# Patient Record
Sex: Male | Born: 1995 | Race: Black or African American | Hispanic: No | Marital: Single | Smoking: Never smoker
Health system: Southern US, Community
[De-identification: ages and names within clinical notes are randomized; demographics above are authoritative.]

---

## 2016-03-30 ENCOUNTER — Encounter: Payer: Self-pay | Admitting: Emergency Medicine

## 2016-03-30 ENCOUNTER — Emergency Department
Admission: EM | Admit: 2016-03-30 | Discharge: 2016-03-30 | Disposition: A | Discharge status: Home / Self Care | Payer: Medicaid Other | Attending: Emergency Medicine | Admitting: Emergency Medicine

## 2016-03-30 DIAGNOSIS — H66004 Acute suppurative otitis media without spontaneous rupture of ear drum, recurrent, right ear: Secondary | ICD-10-CM | POA: Insufficient documentation

## 2016-03-30 DIAGNOSIS — H66001 Acute suppurative otitis media without spontaneous rupture of ear drum, right ear: Secondary | ICD-10-CM

## 2016-03-30 DIAGNOSIS — H9191 Unspecified hearing loss, right ear: Secondary | ICD-10-CM | POA: Diagnosis present

## 2016-03-30 MED ORDER — AMOXICILLIN 500 MG PO CAPS: 500.0000 mg | ORAL_CAPSULE | Freq: Two times a day (BID) | ORAL | 0 refills | Status: AC

## 2016-03-30 NOTE — ED Notes (Signed)
Patient denies drainage and trauma however admits to tinnitus in the right ear

## 2016-03-30 NOTE — Discharge Instructions (Signed)
Take the antibiotic as directed. Follow-up with ENT as directed. Avoid getting any water into your ear.

## 2016-03-30 NOTE — ED Triage Notes (Signed)
Pt presents with intermittent hearing loss in right ear x approximately 2 weeks. Pt states it comes and goes, and when it comes "it stays a while." Pt denies pain; alert & oriented with NAD noted.

## 2016-03-30 NOTE — ED Provider Notes (Signed)
[facility] Emergency Department Provider Note ____________________________________________  Time seen: 1340  I have reviewed the triage vital signs and the nursing notes.  HISTORY  Chief Complaint  Hearing Loss  HPI Kenneth Meza is a 20 y.o. male presents to the ED for evaluation of intermittent hearing change to the right ear over the last 2 weeks. The patient comes in with decreased hearing at baseline to the right ear secondary to tympanic membrane tube placement less than 2 years ago. 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 This led to the placement of TM tubes late in his senior year. He reports at baseline he has slightly decreased hearing on that side. He reports over the last 2 weeks he is described but he notes as worsening of his hearing. He denies any ear pain, ear drainage, vertigo, dizziness, or ringing in the ears above baseline. He also denies any trauma to the ear, fluid in the ear, or recent illness. He has not followed up with his previous ear nose and throat provider nor has he seen a local primary care provider.  History reviewed. No pertinent past medical history.  There are no active problems to display for this patient.  History reviewed. No pertinent surgical history.  Prior to Admission medications   Medication Sig Start Date End Date Taking? Authorizing Provider  amoxicillin (AMOXIL) 500 MG capsule Take 1 capsule (500 mg total) by mouth 2 (two) times daily. 03/30/16 04/09/16  Jalesia Loudenslager V Bacon Cody Oliger, PA-C    Allergies Review of patient's allergies indicates no known allergies.  History reviewed. No pertinent family history.  Social History Social History  Substance Use Topics  . Smoking status: Never Smoker  . Smokeless tobacco: Never Used  . Alcohol use No   Review of Systems  Constitutional: Negative for fever. Eyes: Negative for visual changes. ENT: Negative for sore throat. Decreased hearing  over baseline as noted above. Neurological: Negative for headaches, focal weakness or numbness. ____________________________________________  PHYSICAL EXAM:  VITAL SIGNS: ED Triage Vitals  Enc Vitals Group     BP 03/30/16 1324 122/68     Pulse Rate 03/30/16 1324 86     Resp 03/30/16 1500 16     Temp 03/30/16 1324 98.5 F (36.9 C)     Temp Source 03/30/16 1324 Oral     SpO2 03/30/16 1324 98 %     Weight 03/30/16 1325 220 lb (99.8 kg)     Height 03/30/16 1325 5\' 9"  (1.753 m)     Head Circumference --      Peak Flow --      Pain Score 03/30/16 1500 1     Pain Loc --      Pain Edu? --      Excl. in GC? --     Constitutional: Alert and oriented. Well appearing and in no distress. Head: Normocephalic and atraumatic. Eyes: Conjunctivae are normal. PERRL. Normal extraocular movements Ears: Canals clear and TM intact on the left. Right canal with TM partially obscured by a small amount of cerumen. TM on the right is further obscured by purulent material in the canal at the TM. No clearly visible tympanoplasty tube.  Nose: No congestion/rhinorrhea. Mouth/Throat: Mucous membranes are moist. Neurologic:  Normal gait without ataxia. Normal speech and language. No gross focal neurologic deficits are appreciated. ____________________________________________  INITIAL IMPRESSION / ASSESSMENT AND PLAN / ED COURSE  Patient with a pure let drainage to the right ear  likely due to an intact and patent tympanoplasty tube. He will be discharged with a empiric prescription for amoxicillin to dose as directed. [facility] is [facility] [place] [facility] [place]. He is to avoid to getting water in the ear in the interim.   Clinical Course   ____________________________________________  FINAL CLINICAL IMPRESSION(S) / ED DIAGNOSES  Final diagnoses:  Acute suppurative otitis media of right ear without spontaneous rupture of tympanic membrane, recurrence not specified       Lissa HoardJenise V Bacon Aleck Locklin, PA-C 03/30/16 1922    Myrna Blazeravid Matthew Schaevitz, MD 03/31/16 [phone]

## 2016-05-02 MED ORDER — LIDOCAINE VISCOUS 2 % MT SOLN
OROMUCOSAL | Status: AC
  Filled 2016-05-02: qty 15

## 2016-08-30 ENCOUNTER — Encounter (HOSPITAL_COMMUNITY): Payer: Self-pay

## 2016-08-30 ENCOUNTER — Emergency Department (HOSPITAL_COMMUNITY): Payer: Medicaid Other

## 2016-08-30 ENCOUNTER — Observation Stay (HOSPITAL_COMMUNITY)
Admission: EM | Admit: 2016-08-30 | Discharge: 2016-09-01 | Disposition: A | Discharge status: Home / Self Care | Payer: Medicaid Other | Attending: General Surgery | Admitting: General Surgery

## 2016-08-30 DIAGNOSIS — K353 Acute appendicitis with localized peritonitis: Secondary | ICD-10-CM | POA: Diagnosis not present

## 2016-08-30 DIAGNOSIS — K3580 Unspecified acute appendicitis: Secondary | ICD-10-CM | POA: Diagnosis present

## 2016-08-30 DIAGNOSIS — K37 Unspecified appendicitis: Secondary | ICD-10-CM | POA: Diagnosis present

## 2016-08-30 DIAGNOSIS — K381 Appendicular concretions: Secondary | ICD-10-CM | POA: Insufficient documentation

## 2016-08-30 DIAGNOSIS — I10 Essential (primary) hypertension: Secondary | ICD-10-CM | POA: Diagnosis not present

## 2016-08-30 DIAGNOSIS — E119 Type 2 diabetes mellitus without complications: Secondary | ICD-10-CM | POA: Insufficient documentation

## 2016-08-30 DIAGNOSIS — R590 Localized enlarged lymph nodes: Secondary | ICD-10-CM | POA: Insufficient documentation

## 2016-08-30 LAB — URINALYSIS, ROUTINE W REFLEX MICROSCOPIC
Bilirubin Urine: NEGATIVE
GLUCOSE, UA: NEGATIVE mg/dL
Hgb urine dipstick: NEGATIVE
Ketones, ur: NEGATIVE mg/dL
LEUKOCYTES UA: NEGATIVE
Nitrite: NEGATIVE
PH: 6 (ref 5.0–8.0)
Protein, ur: NEGATIVE mg/dL
SPECIFIC GRAVITY, URINE: 1.023 (ref 1.005–1.030)

## 2016-08-30 LAB — CBC
HEMATOCRIT: 41.3 % (ref 39.0–52.0)
HEMOGLOBIN: 14.1 g/dL (ref 13.0–17.0)
MCH: 25.9 pg — ABNORMAL LOW (ref 26.0–34.0)
MCHC: 34.1 g/dL (ref 30.0–36.0)
MCV: 75.8 fL — AB (ref 78.0–100.0)
Platelets: 238 10*3/uL (ref 150–400)
RBC: 5.45 MIL/uL (ref 4.22–5.81)
RDW: 14.4 % (ref 11.5–15.5)
WBC: 21 10*3/uL — AB (ref 4.0–10.5)

## 2016-08-30 LAB — COMPREHENSIVE METABOLIC PANEL
ALT: 23 U/L (ref 17–63)
AST: 21 U/L (ref 15–41)
Albumin: 4.3 g/dL (ref 3.5–5.0)
Alkaline Phosphatase: 70 U/L (ref 38–126)
Anion gap: 7 (ref 5–15)
BUN: 9 mg/dL (ref 6–20)
CHLORIDE: 103 mmol/L (ref 101–111)
CO2: 27 mmol/L (ref 22–32)
Calcium: 9.4 mg/dL (ref 8.9–10.3)
Creatinine, Ser: 0.95 mg/dL (ref 0.61–1.24)
GFR calc Af Amer: >60 mL/min (ref >60)
Glucose, Bld: 115 mg/dL — ABNORMAL HIGH (ref 65–99)
POTASSIUM: 4.1 mmol/L (ref 3.5–5.1)
Sodium: 137 mmol/L (ref 135–145)
Total Bilirubin: 0.3 mg/dL (ref 0.3–1.2)
Total Protein: 7.5 g/dL (ref 6.5–8.1)

## 2016-08-30 LAB — LIPASE, BLOOD: LIPASE: 14 U/L (ref 11–51)

## 2016-08-30 MED ORDER — IOPAMIDOL (ISOVUE-300) INJECTION 61%
100.0000 mL | Freq: Once | INTRAVENOUS | Status: AC | PRN
  Administered 2016-08-30: 100 mL via INTRAVENOUS

## 2016-08-30 MED ORDER — SODIUM CHLORIDE 0.9 % IJ SOLN
INTRAMUSCULAR | Status: AC
  Filled 2016-08-30: qty 50

## 2016-08-30 MED ORDER — IOPAMIDOL (ISOVUE-300) INJECTION 61%
INTRAVENOUS | Status: AC
  Filled 2016-08-30: qty 100

## 2016-08-30 MED ORDER — MORPHINE SULFATE (PF) 4 MG/ML IV SOLN
4.0000 mg | Freq: Once | INTRAVENOUS | Status: AC
  Administered 2016-08-30: 4 mg via INTRAVENOUS
  Filled 2016-08-30: qty 1

## 2016-08-30 MED ORDER — ONDANSETRON HCL 4 MG/2ML IJ SOLN
4.0000 mg | Freq: Once | INTRAMUSCULAR | Status: AC
  Administered 2016-08-30: 4 mg via INTRAVENOUS
  Filled 2016-08-30: qty 2

## 2016-08-30 MED ORDER — SODIUM CHLORIDE 0.9 % IV BOLUS (SEPSIS)
1000.0000 mL | Freq: Once | INTRAVENOUS | Status: AC
  Administered 2016-08-30: 1000 mL via INTRAVENOUS

## 2016-08-30 NOTE — ED Notes (Signed)
Bed: WA06 Expected date:  Expected time:  Means of arrival:  Comments: Needs deep cleaning

## 2016-08-30 NOTE — ED Notes (Signed)
Patient transported to CT 

## 2016-08-30 NOTE — ED Provider Notes (Signed)
Emergency Department Provider Note   I have reviewed the triage vital signs and the nursing notes.   HISTORY  Chief Complaint Abdominal Pain   HPI Kenneth Meza is a 21 y.o. male with no significant PMH presents to the emergency department for evaluation of abdominal pain and vomiting. Patient states that this afternoon he was eating some supper and shortly afterwards developed some severe, sudden onset right sided abdominal pain. He had nausea and vomiting x 4. No diarrhea. No fevers or chills. No other sick contacts. He has no surgical history. He describes his pain is mostly right-sided, nonradiating, severe, constant. No modifying factors.   History reviewed. No pertinent past medical history.  Patient Active Problem List   Diagnosis Date Noted  . Appendicitis 08/31/2016    History reviewed. No pertinent surgical history.    Allergies Patient has no known allergies.  History reviewed. No pertinent family history.  Social History Social History  Substance Use Topics  . Smoking status: Never Smoker  . Smokeless tobacco: Never Used  . Alcohol use No    Review of Systems  Constitutional: No fever/chills Eyes: No visual changes. ENT: No sore throat. Cardiovascular: Denies chest pain. Respiratory: Denies shortness of breath. Gastrointestinal: Positive RLQ abdominal pain. Positive nausea and vomiting.  No diarrhea.  No constipation. Genitourinary: Negative for dysuria. Musculoskeletal: Negative for back pain. Skin: Negative for rash. Neurological: Negative for headaches, focal weakness or numbness.  10-point ROS otherwise negative.  ____________________________________________   PHYSICAL EXAM:  VITAL SIGNS: ED Triage Vitals  Enc Vitals Group     BP 08/30/16 2122 140/77     Pulse Rate 08/30/16 2122 94     Resp 08/30/16 2122 20     Temp 08/30/16 2122 98.2 F (36.8 C)     Temp src --      SpO2 08/30/16 2122 100 %     Weight 08/30/16 2122 220 lb  (99.8 kg)     Height 08/30/16 2122 6' (1.829 m)     Pain Score 08/30/16 2123 10   Constitutional: Alert and oriented. Well appearing and in no acute distress. Eyes: Conjunctivae are normal.  Head: Atraumatic. Nose: No congestion/rhinnorhea. Mouth/Throat: Mucous membranes are moist.  Oropharynx non-erythematous. Neck: No stridor. Cardiovascular: Normal rate, regular rhythm. Good peripheral circulation. Grossly normal heart sounds.   Respiratory: Normal respiratory effort.  No retractions. Lungs CTAB. Gastrointestinal: Soft with focal RLQ tenderness with guarding. No rebound. Positive rosving's signs.  No distention.  Musculoskeletal: No lower extremity tenderness nor edema. No gross deformities of extremities. Neurologic:  Normal speech and language. No gross focal neurologic deficits are appreciated.  Skin:  Skin is warm, dry and intact. No rash noted. Psychiatric: Mood and affect are normal. Speech and behavior are normal.  ____________________________________________   LABS (all labs ordered are listed, but only abnormal results are displayed)  Labs Reviewed  COMPREHENSIVE METABOLIC PANEL - Abnormal; Notable for the following:       Result Value   Glucose, Bld 115 (*)    All other components within normal limits  CBC - Abnormal; Notable for the following:    WBC 21.0 (*)    MCV 75.8 (*)    MCH 25.9 (*)    All other components within normal limits  SURGICAL PCR SCREEN  LIPASE, BLOOD  URINALYSIS, ROUTINE W REFLEX MICROSCOPIC  HIV ANTIBODY (ROUTINE TESTING)  SURGICAL PATHOLOGY   ____________________________________________  RADIOLOGY  Ct Abdomen Pelvis W Contrast  Result Date: 08/31/2016 CLINICAL DATA:  Sudden onset right lower quadrant pain EXAM: CT ABDOMEN AND PELVIS WITH CONTRAST TECHNIQUE: Multidetector CT imaging of the abdomen and pelvis was performed using the standard protocol following bolus administration of intravenous contrast. CONTRAST:  ISOVUE-300  IOPAMIDOL (ISOVUE-300) INJECTION 61% COMPARISON:  None. FINDINGS: Lower chest: No acute abnormality. Hepatobiliary: No focal liver abnormality is seen. No gallstones, gallbladder wall thickening, or biliary dilatation. Pancreas: Unremarkable. No pancreatic ductal dilatation or surrounding inflammatory changes. Spleen: Normal in size without focal abnormality. Adrenals/Urinary Tract: Adrenal glands are unremarkable. Kidneys are normal, without renal calculi, focal lesion, or hydronephrosis. Bladder is unremarkable. Stomach/Bowel: Stomach is nonenlarged.  No dilated small bowel. The appendix is enlarged, measuring up to 12 mm. There is an 8 mm appendicolith in the proximal lumen of the appendix. There is wall enhancement and moderate fluid and inflammatory change around the appendix and within the right para colic gutter. Vascular/Lymphatic: Multiple enlarged mesenteric lymph nodes, measuring up to 9 mm. Non aneurysmal aorta. Reproductive: Prostate is unremarkable. Other: Small free fluid in the pelvis.  No free air. Musculoskeletal: No acute or significant osseous findings. IMPRESSION: 1. Findings consistent with acute appendicitis. 8 mm appendicolith in the proximal lumen of the appendix. Appendix is retrocecal. No extraluminal gas or evidence for an abscess. 2. Small amount of free fluid in the pelvis 3. Multiple enlarged mesenteric lymph nodes. Electronically Signed   By: Jasmine Pang M.D.   On: 08/31/2016 00:12    ____________________________________________   PROCEDURES  Procedure(s) performed:   Procedures  None ____________________________________________   INITIAL IMPRESSION / ASSESSMENT AND PLAN / ED COURSE  Pertinent labs & imaging results that were available during my care of the patient were reviewed by me and considered in my medical decision making (see chart for details).  Patient presents to the emergency department for evaluation of right lower quadrant abdominal pain associated  nausea and vomiting. Patient has had subjective fevers and chills prior to arrival. His white blood cell count is 21. He has focal tenderness in his right lower quadrant with positive Rovsing sign and guarding in the right lower quadrant. Plan for CT abdomen/pelvis to evaluate for appendicitis.   CT pending. Care transferred to night physician who will follow up scan and involve surgery PRN.   ____________________________________________  FINAL CLINICAL IMPRESSION(S) / ED DIAGNOSES  Final diagnoses:  Acute appendicitis, unspecified acute appendicitis type     MEDICATIONS GIVEN DURING THIS VISIT:  Medications  sodium chloride 0.9 % injection (not administered)  iopamidol (ISOVUE-300) 61 % injection (not administered)  0.9 %  sodium chloride infusion ( Intravenous New Bag/Given 08/31/16 0525)  piperacillin-tazobactam (ZOSYN) IVPB 3.375 g ( Intravenous Automatically Held 09/08/16 2200)  morphine 4 MG/ML injection 1-3 mg ( Intravenous MAR Hold 08/31/16 0825)  diphenhydrAMINE (BENADRYL) 12.5 MG/5ML elixir 12.5 mg ( Oral MAR Hold 08/31/16 0825)    Or  diphenhydrAMINE (BENADRYL) injection 12.5 mg ( Intravenous MAR Hold 08/31/16 0825)  ondansetron (ZOFRAN-ODT) disintegrating tablet 4 mg ( Oral MAR Hold 08/31/16 0825)    Or  ondansetron (ZOFRAN) injection 4 mg ( Intravenous MAR Hold 08/31/16 0825)  pantoprazole (PROTONIX) injection 40 mg ( Intravenous Automatically Held 09/08/16 2200)  lactated ringers infusion ( Intravenous New Bag/Given 08/31/16 0842)  bupivacaine (PF) (MARCAINE) 0.25 % injection (30 mLs Infiltration Given 08/31/16 0000)  lactated ringers irrigation solution (1,000 mLs Irrigation Given 08/31/16 0000)  sodium chloride 0.9 % bolus 1,000 mL (0 mLs Intravenous Stopped 08/31/16 0040)  morphine 4 MG/ML injection 4 mg (4 mg  Intravenous Given 08/30/16 2311)  ondansetron (ZOFRAN) injection 4 mg (4 mg Intravenous Given 08/30/16 2311)  iopamidol (ISOVUE-300) 61 % injection 100 mL (100 mLs  Intravenous Contrast Given 08/30/16 2354)  cefTRIAXone (ROCEPHIN) 2 g in dextrose 5 % 50 mL IVPB (0 g Intravenous Stopped 08/31/16 0135)    And  metroNIDAZOLE (FLAGYL) IVPB 500 mg (0 mg Intravenous Stopped 08/31/16 0249)     NEW OUTPATIENT MEDICATIONS STARTED DURING THIS VISIT:  None   Note:  This document was prepared using Dragon voice recognition software and may include unintentional dictation errors.  Alona Bene, MD Emergency Medicine   Maia Plan, MD 08/31/16 [phone]

## 2016-08-30 NOTE — ED Triage Notes (Signed)
Abdominal pain with nausea and vomiting with chills and fever, no dysuria voiced.

## 2016-08-31 ENCOUNTER — Encounter (HOSPITAL_COMMUNITY)
Admission: EM | Disposition: A | Discharge status: Home / Self Care | Payer: Self-pay | Source: Home / Self Care | Attending: Emergency Medicine

## 2016-08-31 ENCOUNTER — Ambulatory Visit: Admit: 2016-08-31 | Payer: Medicaid Other | Admitting: Surgery

## 2016-08-31 ENCOUNTER — Observation Stay (HOSPITAL_COMMUNITY): Payer: Medicaid Other | Admitting: Anesthesiology

## 2016-08-31 DIAGNOSIS — K381 Appendicular concretions: Secondary | ICD-10-CM | POA: Diagnosis not present

## 2016-08-31 DIAGNOSIS — E119 Type 2 diabetes mellitus without complications: Secondary | ICD-10-CM | POA: Diagnosis not present

## 2016-08-31 DIAGNOSIS — K37 Unspecified appendicitis: Secondary | ICD-10-CM | POA: Diagnosis present

## 2016-08-31 DIAGNOSIS — R590 Localized enlarged lymph nodes: Secondary | ICD-10-CM | POA: Diagnosis not present

## 2016-08-31 DIAGNOSIS — K353 Acute appendicitis with localized peritonitis: Secondary | ICD-10-CM | POA: Diagnosis not present

## 2016-08-31 HISTORY — PX: LAPAROSCOPIC APPENDECTOMY: SHX408

## 2016-08-31 LAB — SURGICAL PCR SCREEN
MRSA, PCR: NEGATIVE
Staphylococcus aureus: NEGATIVE

## 2016-08-31 LAB — GLUCOSE, CAPILLARY
Glucose-Capillary: 126 mg/dL — ABNORMAL HIGH (ref 65–99)
Glucose-Capillary: 161 mg/dL — ABNORMAL HIGH (ref 65–99)

## 2016-08-31 SURGERY — APPENDECTOMY, LAPAROSCOPIC
Anesthesia: General | Site: Abdomen

## 2016-08-31 MED ORDER — ROCURONIUM BROMIDE 50 MG/5ML IV SOSY
PREFILLED_SYRINGE | INTRAVENOUS | Status: AC
  Filled 2016-08-31: qty 5

## 2016-08-31 MED ORDER — LACTATED RINGERS IR SOLN
Status: DC | PRN
  Administered 2016-08-31: 1000 mL

## 2016-08-31 MED ORDER — ROCURONIUM BROMIDE 10 MG/ML (PF) SYRINGE
PREFILLED_SYRINGE | INTRAVENOUS | Status: DC | PRN
  Administered 2016-08-31: 30 mg via INTRAVENOUS
  Administered 2016-08-31: 20 mg via INTRAVENOUS

## 2016-08-31 MED ORDER — HYDROMORPHONE HCL 1 MG/ML IJ SOLN
0.2500 mg | INTRAMUSCULAR | Status: DC | PRN
  Administered 2016-08-31 (×4): 0.25 mg via INTRAVENOUS

## 2016-08-31 MED ORDER — LIDOCAINE 2% (20 MG/ML) 5 ML SYRINGE
INTRAMUSCULAR | Status: AC
  Filled 2016-08-31: qty 5

## 2016-08-31 MED ORDER — ONDANSETRON HCL 4 MG/2ML IJ SOLN
INTRAMUSCULAR | Status: DC | PRN
  Administered 2016-08-31: 4 mg via INTRAVENOUS

## 2016-08-31 MED ORDER — MORPHINE SULFATE (PF) 4 MG/ML IV SOLN: 1.0000 mg | INTRAVENOUS | Status: DC | PRN

## 2016-08-31 MED ORDER — PHENYLEPHRINE 40 MCG/ML (10ML) SYRINGE FOR IV PUSH (FOR BLOOD PRESSURE SUPPORT)
PREFILLED_SYRINGE | INTRAVENOUS | Status: AC
Start: 2016-08-31 — End: 2016-08-31
  Filled 2016-08-31: qty 10

## 2016-08-31 MED ORDER — ONDANSETRON HCL 4 MG/2ML IJ SOLN
INTRAMUSCULAR | Status: AC
  Filled 2016-08-31: qty 2

## 2016-08-31 MED ORDER — IBUPROFEN 200 MG PO TABS: 600.0000 mg | ORAL_TABLET | Freq: Four times a day (QID) | ORAL | Status: DC | PRN

## 2016-08-31 MED ORDER — SUGAMMADEX SODIUM 200 MG/2ML IV SOLN
INTRAVENOUS | Status: DC | PRN
  Administered 2016-08-31: 200 mg via INTRAVENOUS

## 2016-08-31 MED ORDER — PIPERACILLIN-TAZOBACTAM 3.375 G IVPB
3.3750 g | Freq: Three times a day (TID) | INTRAVENOUS | Status: DC
  Administered 2016-08-31 – 2016-09-01 (×4): 3.375 g via INTRAVENOUS
  Filled 2016-08-31 (×6): qty 50

## 2016-08-31 MED ORDER — SUCCINYLCHOLINE CHLORIDE 200 MG/10ML IV SOSY
PREFILLED_SYRINGE | INTRAVENOUS | Status: AC
  Filled 2016-08-31: qty 10

## 2016-08-31 MED ORDER — SUCCINYLCHOLINE CHLORIDE 200 MG/10ML IV SOSY
PREFILLED_SYRINGE | INTRAVENOUS | Status: DC | PRN
  Administered 2016-08-31: 120 mg via INTRAVENOUS

## 2016-08-31 MED ORDER — BUPIVACAINE HCL (PF) 0.25 % IJ SOLN
INTRAMUSCULAR | Status: AC
  Filled 2016-08-31: qty 30

## 2016-08-31 MED ORDER — FENTANYL CITRATE (PF) 100 MCG/2ML IJ SOLN
INTRAMUSCULAR | Status: DC | PRN
  Administered 2016-08-31: 100 ug via INTRAVENOUS
  Administered 2016-08-31: 50 ug via INTRAVENOUS

## 2016-08-31 MED ORDER — MIDAZOLAM HCL 2 MG/2ML IJ SOLN
INTRAMUSCULAR | Status: AC
  Filled 2016-08-31: qty 2

## 2016-08-31 MED ORDER — KETOROLAC TROMETHAMINE 30 MG/ML IJ SOLN: 30.0000 mg | Freq: Once | INTRAMUSCULAR | Status: DC | PRN

## 2016-08-31 MED ORDER — METRONIDAZOLE IN NACL 5-0.79 MG/ML-% IV SOLN
500.0000 mg | Freq: Once | INTRAVENOUS | Status: AC
  Administered 2016-08-31: 500 mg via INTRAVENOUS
  Filled 2016-08-31: qty 100

## 2016-08-31 MED ORDER — FENTANYL CITRATE (PF) 250 MCG/5ML IJ SOLN
INTRAMUSCULAR | Status: AC
  Filled 2016-08-31: qty 5

## 2016-08-31 MED ORDER — MORPHINE SULFATE (PF) 4 MG/ML IV SOLN
1.0000 mg | INTRAVENOUS | Status: DC | PRN
  Administered 2016-08-31: 2 mg via INTRAVENOUS
  Filled 2016-08-31: qty 1

## 2016-08-31 MED ORDER — BUPIVACAINE HCL (PF) 0.25 % IJ SOLN
INTRAMUSCULAR | Status: DC | PRN
  Administered 2016-08-31: 30 mL

## 2016-08-31 MED ORDER — ONDANSETRON 4 MG PO TBDP: 4.0000 mg | ORAL_TABLET | Freq: Four times a day (QID) | ORAL | Status: DC | PRN

## 2016-08-31 MED ORDER — SODIUM CHLORIDE 0.9 % IV SOLN
INTRAVENOUS | Status: DC
  Administered 2016-08-31: 16:00:00 via INTRAVENOUS

## 2016-08-31 MED ORDER — DEXAMETHASONE SODIUM PHOSPHATE 10 MG/ML IJ SOLN
INTRAMUSCULAR | Status: DC | PRN
  Administered 2016-08-31: 10 mg via INTRAVENOUS

## 2016-08-31 MED ORDER — HYDROCODONE-ACETAMINOPHEN 5-325 MG PO TABS: 1.0000 | ORAL_TABLET | Freq: Four times a day (QID) | ORAL | Status: DC | PRN

## 2016-08-31 MED ORDER — PROPOFOL 10 MG/ML IV BOLUS
INTRAVENOUS | Status: DC | PRN
  Administered 2016-08-31: 200 mg via INTRAVENOUS

## 2016-08-31 MED ORDER — LACTATED RINGERS IV SOLN
INTRAVENOUS | Status: DC
  Administered 2016-08-31 (×2): via INTRAVENOUS

## 2016-08-31 MED ORDER — PANTOPRAZOLE SODIUM 40 MG IV SOLR: 40.0000 mg | Freq: Every day | INTRAVENOUS | Status: DC

## 2016-08-31 MED ORDER — MIDAZOLAM HCL 5 MG/5ML IJ SOLN
INTRAMUSCULAR | Status: DC | PRN
  Administered 2016-08-31: 2 mg via INTRAVENOUS

## 2016-08-31 MED ORDER — DEXAMETHASONE SODIUM PHOSPHATE 10 MG/ML IJ SOLN
INTRAMUSCULAR | Status: AC
  Filled 2016-08-31: qty 1

## 2016-08-31 MED ORDER — SUGAMMADEX SODIUM 200 MG/2ML IV SOLN
INTRAVENOUS | Status: AC
  Filled 2016-08-31: qty 2

## 2016-08-31 MED ORDER — DIPHENHYDRAMINE HCL 50 MG/ML IJ SOLN: 12.5000 mg | Freq: Four times a day (QID) | INTRAMUSCULAR | Status: DC | PRN

## 2016-08-31 MED ORDER — DEXTROSE 5 % IV SOLN
2.0000 g | Freq: Once | INTRAVENOUS | Status: AC
  Administered 2016-08-31: 2 g via INTRAVENOUS
  Filled 2016-08-31: qty 2

## 2016-08-31 MED ORDER — LIDOCAINE 2% (20 MG/ML) 5 ML SYRINGE
INTRAMUSCULAR | Status: DC | PRN
  Administered 2016-08-31: 100 mg via INTRAVENOUS

## 2016-08-31 MED ORDER — PROMETHAZINE HCL 25 MG/ML IJ SOLN
6.2500 mg | INTRAMUSCULAR | Status: AC | PRN
  Administered 2016-08-31 (×2): 6.25 mg via INTRAVENOUS

## 2016-08-31 MED ORDER — ONDANSETRON HCL 4 MG/2ML IJ SOLN: 4.0000 mg | Freq: Four times a day (QID) | INTRAMUSCULAR | Status: DC | PRN

## 2016-08-31 MED ORDER — PROPOFOL 10 MG/ML IV BOLUS
INTRAVENOUS | Status: AC
  Filled 2016-08-31: qty 20

## 2016-08-31 MED ORDER — SODIUM CHLORIDE 0.9 % IV SOLN
INTRAVENOUS | Status: DC
  Administered 2016-08-31 (×2): via INTRAVENOUS

## 2016-08-31 MED ORDER — DIPHENHYDRAMINE HCL 12.5 MG/5ML PO ELIX: 12.5000 mg | ORAL_SOLUTION | Freq: Four times a day (QID) | ORAL | Status: DC | PRN

## 2016-08-31 MED ORDER — PROMETHAZINE HCL 25 MG/ML IJ SOLN
INTRAMUSCULAR | Status: AC
  Administered 2016-08-31: 6.25 mg via INTRAVENOUS
  Filled 2016-08-31: qty 1

## 2016-08-31 MED ORDER — HYDROMORPHONE HCL 1 MG/ML IJ SOLN
INTRAMUSCULAR | Status: AC
  Administered 2016-08-31: 0.25 mg via INTRAVENOUS
  Filled 2016-08-31: qty 1

## 2016-08-31 SURGICAL SUPPLY — 38 items
APPLIER CLIP ROT 10 11.4 M/L (STAPLE)
BENZOIN TINCTURE PRP APPL 2/3 (GAUZE/BANDAGES/DRESSINGS) IMPLANT
CABLE HIGH FREQUENCY MONO STRZ (ELECTRODE) ×3 IMPLANT
CHLORAPREP W/TINT 26ML (MISCELLANEOUS) ×3 IMPLANT
CLIP APPLIE ROT 10 11.4 M/L (STAPLE) IMPLANT
CLOSURE WOUND 1/2 X4 (GAUZE/BANDAGES/DRESSINGS)
COVER SURGICAL LIGHT HANDLE (MISCELLANEOUS) ×3 IMPLANT
CUTTER FLEX LINEAR 45M (STAPLE) IMPLANT
DECANTER SPIKE VIAL GLASS SM (MISCELLANEOUS) IMPLANT
DERMABOND ADVANCED (GAUZE/BANDAGES/DRESSINGS) ×2
DERMABOND ADVANCED .7 DNX12 (GAUZE/BANDAGES/DRESSINGS) ×1 IMPLANT
DRAPE LAPAROSCOPIC ABDOMINAL (DRAPES) ×3 IMPLANT
ELECT PENCIL ROCKER SW 15FT (MISCELLANEOUS) ×3 IMPLANT
ELECT REM PT RETURN 9FT ADLT (ELECTROSURGICAL) ×3
ELECTRODE REM PT RTRN 9FT ADLT (ELECTROSURGICAL) ×1 IMPLANT
ENDOLOOP SUT PDS II  0 18 (SUTURE)
ENDOLOOP SUT PDS II 0 18 (SUTURE) IMPLANT
GLOVE SURG SIGNA 7.5 PF LTX (GLOVE) ×3 IMPLANT
GOWN STRL REUS W/TWL XL LVL3 (GOWN DISPOSABLE) ×6 IMPLANT
IRRIG SUCT STRYKERFLOW 2 WTIP (MISCELLANEOUS) ×3
IRRIGATION SUCT STRKRFLW 2 WTP (MISCELLANEOUS) ×1 IMPLANT
KIT BASIN OR (CUSTOM PROCEDURE TRAY) ×3 IMPLANT
L-HOOK LAP DISP 36CM (ELECTROSURGICAL) ×3
LHOOK LAP DISP 36CM (ELECTROSURGICAL) ×1 IMPLANT
POUCH SPECIMEN RETRIEVAL 10MM (ENDOMECHANICALS) ×3 IMPLANT
RELOAD 45 VASCULAR/THIN (ENDOMECHANICALS) IMPLANT
RELOAD STAPLE TA45 3.5 REG BLU (ENDOMECHANICALS) ×3 IMPLANT
SCISSORS LAP 5X35 DISP (ENDOMECHANICALS) ×3 IMPLANT
SHEARS HARMONIC ACE PLUS 36CM (ENDOMECHANICALS) ×3 IMPLANT
SLEEVE XCEL OPT CAN 5 100 (ENDOMECHANICALS) ×3 IMPLANT
STRIP CLOSURE SKIN 1/2X4 (GAUZE/BANDAGES/DRESSINGS) IMPLANT
SUT MNCRL AB 4-0 PS2 18 (SUTURE) ×6 IMPLANT
SUT VIC AB 2-0 SH 18 (SUTURE) IMPLANT
TOWEL OR 17X26 10 PK STRL BLUE (TOWEL DISPOSABLE) ×3 IMPLANT
TOWEL OR NON WOVEN STRL DISP B (DISPOSABLE) ×3 IMPLANT
TRAY LAPAROSCOPIC (CUSTOM PROCEDURE TRAY) ×3 IMPLANT
TROCAR BLADELESS OPT 5 100 (ENDOMECHANICALS) ×3 IMPLANT
TROCAR XCEL BLUNT TIP 100MML (ENDOMECHANICALS) ×3 IMPLANT

## 2016-08-31 NOTE — H&P (Signed)
Kenneth Meza is an 21 y.o. male.   Chief Complaint: abdominal pain, nausea and vomiting HPI: Pt presented to the ED with nausea and vomiting, abdominal pain.  It started after eating lunch, sudden onset, pain was in RLQ.  Pain nausea and vomiting became worse, he may have had some fever but did not take his temperature.  His family brought him to the ED for evaluation.   Work up in the ED shows he is afebrile, VSS.  WBC 21K, other labs are normal.CT scan shows:  1. Findings consistent with acute appendicitis. 8 mm appendicolith in the proximal lumen of the appendix. Appendix is retrocecal. No extraluminal gas or evidence for an abscess. 2. Small amount of free fluid in the pelvis 3. Multiple enlarged mesenteric lymph nodes.   We are ask to see.   History reviewed. No pertinent past medical history. None History reviewed. No pertinent surgical history. Tympanoplasty   History reviewed. No pertinent family history.  + AODM + Hypertension  Social History:  reports that he has never smoked. He has never used smokeless tobacco. He reports that he does not drink alcohol or use drugs.   Tobacco - none ETOH - none Drugs - none Student and works at [employer]: No Known Allergies  Medications Prior to Admission  Medication Sig Dispense Refill  . bismuth subsalicylate (PEPTO BISMOL) 262 MG/15ML suspension Take 30 mLs by mouth every 6 (six) hours as needed.      Results for orders placed or performed during the hospital encounter of 08/30/16 (from the past 48 hour(s))  Lipase, blood     Status: None   Collection Time: 08/30/16  9:27 PM  Result Value Ref Range   Lipase 14 11 - 51 U/L  Comprehensive metabolic panel     Status: Abnormal   Collection Time: 08/30/16  9:27 PM  Result Value Ref Range   Sodium 137 135 - 145 mmol/L   Potassium 4.1 3.5 - 5.1 mmol/L   Chloride 103 101 - 111 mmol/L   CO2 27 22 - 32 mmol/L   Glucose, Bld 115 (H) 65 - 99 mg/dL   BUN 9 6 - 20 mg/dL    Creatinine, Ser 0.95 0.61 - 1.24 mg/dL   Calcium 9.4 8.9 - 10.3 mg/dL   Total Protein 7.5 6.5 - 8.1 g/dL   Albumin 4.3 3.5 - 5.0 g/dL   AST 21 15 - 41 U/L   ALT 23 17 - 63 U/L   Alkaline Phosphatase 70 38 - 126 U/L   Total Bilirubin 0.3 0.3 - 1.2 mg/dL   GFR calc non Af Amer >60 >60 mL/min   GFR calc Af Amer >60 >60 mL/min    Comment: (NOTE) The eGFR has been calculated using the CKD EPI equation. This calculation has not been validated in all clinical situations. eGFR's persistently <60 mL/min signify possible Chronic Kidney Disease.    Anion gap 7 5 - 15  CBC     Status: Abnormal   Collection Time: 08/30/16  9:27 PM  Result Value Ref Range   WBC 21.0 (H) 4.0 - 10.5 K/uL   RBC 5.45 4.22 - 5.81 MIL/uL   Hemoglobin 14.1 13.0 - 17.0 g/dL   HCT 41.3 39.0 - 52.0 %   MCV 75.8 (L) 78.0 - 100.0 fL   MCH 25.9 (L) 26.0 - 34.0 pg   MCHC 34.1 30.0 - 36.0 g/dL   RDW 14.4 11.5 - 15.5 %   Platelets 238 150 -  400 K/uL  Urinalysis, Routine w reflex microscopic     Status: None   Collection Time: 08/30/16 10:29 PM  Result Value Ref Range   Color, Urine YELLOW YELLOW   APPearance CLEAR CLEAR   Specific Gravity, Urine 1.023 1.005 - 1.030   pH 6.0 5.0 - 8.0   Glucose, UA NEGATIVE NEGATIVE mg/dL   Hgb urine dipstick NEGATIVE NEGATIVE   Bilirubin Urine NEGATIVE NEGATIVE   Ketones, ur NEGATIVE NEGATIVE mg/dL   Protein, ur NEGATIVE NEGATIVE mg/dL   Nitrite NEGATIVE NEGATIVE   Leukocytes, UA NEGATIVE NEGATIVE  Surgical pcr screen     Status: None   Collection Time: 08/31/16  5:27 AM  Result Value Ref Range   MRSA, PCR NEGATIVE NEGATIVE   Staphylococcus aureus NEGATIVE NEGATIVE    Comment:        The Xpert SA Assay (FDA approved for NASAL specimens in patients over 1 years of age), is one component of a comprehensive surveillance program.  Test performance has been validated by [facility] for patients greater than or equal to 57 year old. It is not intended to diagnose infection  nor to guide or monitor treatment.    Ct Abdomen Pelvis W Contrast  Result Date: 08/31/2016 CLINICAL DATA:  Sudden onset right lower quadrant pain EXAM: CT ABDOMEN AND PELVIS WITH CONTRAST TECHNIQUE: Multidetector CT imaging of the abdomen and pelvis was performed using the standard protocol following bolus administration of intravenous contrast. CONTRAST:  122m ISOVUE-300 IOPAMIDOL (ISOVUE-300) INJECTION 61% COMPARISON:  None. FINDINGS: Lower chest: No acute abnormality. Hepatobiliary: No focal liver abnormality is seen. No gallstones, gallbladder wall thickening, or biliary dilatation. Pancreas: Unremarkable. No pancreatic ductal dilatation or surrounding inflammatory changes. Spleen: Normal in size without focal abnormality. Adrenals/Urinary Tract: Adrenal glands are unremarkable. Kidneys are normal, without renal calculi, focal lesion, or hydronephrosis. Bladder is unremarkable. Stomach/Bowel: Stomach is nonenlarged.  No dilated small bowel. The appendix is enlarged, measuring up to 12 mm. There is an 8 mm appendicolith in the proximal lumen of the appendix. There is wall enhancement and moderate fluid and inflammatory change around the appendix and within the right para colic gutter. Vascular/Lymphatic: Multiple enlarged mesenteric lymph nodes, measuring up to 9 mm. Non aneurysmal aorta. Reproductive: Prostate is unremarkable. Other: Small free fluid in the pelvis.  No free air. Musculoskeletal: No acute or significant osseous findings. IMPRESSION: 1. Findings consistent with acute appendicitis. 8 mm appendicolith in the proximal lumen of the appendix. Appendix is retrocecal. No extraluminal gas or evidence for an abscess. 2. Small amount of free fluid in the pelvis 3. Multiple enlarged mesenteric lymph nodes. Electronically Signed   By: KDonavan FoilM.D.   On: 08/31/2016 00:12    Review of Systems  Constitutional: Positive for fever (?).  HENT: Negative.   Eyes: Negative.   Respiratory:  Negative.   Cardiovascular: Negative.   Gastrointestinal: Positive for abdominal pain, nausea and vomiting. Negative for blood in stool, constipation, diarrhea, heartburn and melena.  Genitourinary: Negative.   Musculoskeletal: Negative.   Skin: Negative.   Neurological: Negative.   Endo/Heme/Allergies: Negative.   Psychiatric/Behavioral: Negative.     Blood pressure (!) 159/78, pulse 60, temperature 99 F (37.2 C), temperature source Oral, resp. rate 20, height 6' (1.829 m), weight 99.8 kg (220 lb), SpO2 99 %. Physical Exam  Constitutional: He is oriented to person, place, and time. He appears well-developed and well-nourished. No distress.  HENT:  Head: Normocephalic and atraumatic.  Mouth/Throat: No oropharyngeal exudate.  Eyes: Right  eye exhibits no discharge. Left eye exhibits no discharge. No scleral icterus.  Neck: Normal range of motion. Neck supple. No JVD present. No tracheal deviation present. No thyromegaly present.  Cardiovascular: Normal rate, regular rhythm, normal heart sounds and intact distal pulses.   No murmur heard. Respiratory: Effort normal and breath sounds normal. No respiratory distress. He has no wheezes. He has no rales. He exhibits no tenderness.  GI: Soft. Bowel sounds are normal. He exhibits no distension and no mass. There is tenderness (RLQ). There is no rebound and no guarding.  Musculoskeletal: He exhibits no edema or tenderness.  Lymphadenopathy:    He has no cervical adenopathy.  Neurological: He is alert and oriented to person, place, and time. No cranial nerve deficit.  Skin: Skin is warm and dry. No rash noted. He is not diaphoretic. No erythema. No pallor.  Psychiatric: He has a normal mood and affect. His behavior is normal. Judgment and thought content normal.     Assessment/Plan Acute appendicitis BMI 33  Plan: Surgery later this a.m.   JENNINGS,WILLARD, PA-C 08/31/2016, 7:17 AM  Agree with above. Mother and girlfriend in room.   (The girlfriend was asleep).  I discussed with the patient the indications and risks of appendiceal surgery.  The primary risks of appendiceal surgery include, but are not limited to, bleeding, infection, bowel surgery, and open surgery.  There is also the risk that the patient may have continued symptoms after surgery.  We discussed the typical post-operative recovery course. I tried to answer the patient's questions.  He is a sophomore at [employer].  Alphonsa Overall, MD, [facility] Pager: [phone] Office phone:  [phone]

## 2016-08-31 NOTE — Anesthesia Procedure Notes (Signed)
Procedure Name: Intubation Date/Time: 08/31/2016 9:27 AM Performed by: Lind Covert Pre-anesthesia Checklist: Patient identified, Emergency Drugs available, Suction available, Patient being monitored and Timeout performed Patient Re-evaluated:Patient Re-evaluated prior to inductionOxygen Delivery Method: Circle system utilized Preoxygenation: Pre-oxygenation with 100% oxygen Intubation Type: IV induction Laryngoscope Size: Mac and 4 Grade View: Grade I Tube size: 7.5 mm Number of attempts: 1 Airway Equipment and Method: Stylet Placement Confirmation: ETT inserted through vocal cords under direct vision,  positive ETCO2 and breath sounds checked- equal and bilateral Secured at: 22 cm Tube secured with: Tape Dental Injury: Teeth and Oropharynx as per pre-operative assessment

## 2016-08-31 NOTE — Anesthesia Preprocedure Evaluation (Signed)

## 2016-08-31 NOTE — Discharge Instructions (Signed)
Laparoscopic Appendectomy, Adult, Care After °Refer to this sheet in the next few weeks. These instructions provide you with information about caring for yourself after your procedure. Your health care provider may also give you more specific instructions. Your treatment has been planned according to current medical practices, but problems sometimes occur. Call your health care provider if you have any problems or questions after your procedure. °What can I expect after the procedure? °After the procedure, it is common to have: °· A decrease in your energy level. °· Mild pain in the area where the surgical cuts (incisions) were made. °· Constipation. This can be caused by pain medicine and a decrease in your activity. ° °Follow these instructions at home: °Medicines °· Take over-the-counter and prescription medicines only as told by your health care provider. °· Do not drive for 24 hours if you received a sedative. °· Do not drive or operate heavy machinery while taking prescription pain medicine. °· If you were prescribed an antibiotic medicine, take it as told by your health care provider. Do not stop taking the antibiotic even if you start to feel better. °Activity °· For 3 weeks or as long as told by your health care provider: °? Do not lift anything that is heavier than 10 pounds (4.5 kg). °? Do not play contact sports. °· Gradually return to your normal activities. Ask your health care provider what activities are safe for you. °Bathing °· Keep your incisions clean and dry. Clean them as often as told by your health care provider: °? Gently wash the incisions with soap and water. °? Rinse the incisions with water to remove all soap. °? Pat the incisions dry with a clean towel. Do not rub the incisions. °· You may take showers after 48 hours. °· Do not take baths, swim, or use hot tubs for 2 weeks or as told by your health care provider. °Incision care °· Follow instructions from your healthcare provider about  how to take care of your incisions. Make sure you: °? Wash your hands with soap and water before you change your bandage (dressing). If soap and water are not available, use hand sanitizer. °? Change your dressing as told by your health care provider. °? Leave stitches (sutures), skin glue, or adhesive strips in place. These skin closures may need to stay in place for 2 weeks or longer. If adhesive strip edges start to loosen and curl up, you may trim the loose edges. Do not remove adhesive strips completely unless your health care provider tells you to do that. °· Check your incision areas every day for signs of infection. Check for: °? More redness, swelling, or pain. °? More fluid or blood. °? Warmth. °? Pus or a bad smell. °Other Instructions °· If you were sent home with a drain, follow instructions from your health care provider about how to care for the drain and how to empty it. °· Take deep breaths. This helps to prevent your lungs from becoming inflamed. °· To relieve and prevent constipation: °? Drink plenty of fluids. °? Eat plenty of fruits and vegetables. °· Keep all follow-up visits as told by your health care provider. This is important. °Contact a health care provider if: °· You have more redness, swelling, or pain around an incision. °· You have more fluid or blood coming from an incision. °· Your incision feels warm to the touch. °· You have pus or a bad smell coming from an incision or dressing. °· Your incision   edges break open after your sutures have been removed. °· You have increasing pain in your shoulders. °· You feel dizzy or you faint. °· You develop shortness of breath. °· You keep feeling nauseous or vomiting. °· You have diarrhea or you cannot control your bowel functions. °· You lose your appetite. °· You develop swelling or pain in your legs. °Get help right away if: °· You have a fever. °· You develop a rash. °· You have difficulty breathing. °· You have sharp pains in your  chest. °This information is not intended to replace advice given to you by your health care provider. Make sure you discuss any questions you have with your health care provider. °Document Released: 06/25/2005 Document Revised: 11/25/2015 Document Reviewed: 12/13/2014 °Elsevier Interactive Patient Education © 2017 [employer] ° °CCS ______[facility] °LAPAROSCOPIC SURGERY: POST OP INSTRUCTIONS °Always review your discharge instruction sheet given to you by the facility where your surgery was performed. °IF YOU HAVE DISABILITY OR FAMILY LEAVE FORMS, YOU MUST BRING THEM TO THE OFFICE FOR PROCESSING.   °DO NOT GIVE THEM TO YOUR DOCTOR. ° °1. A prescription for pain medication may be given to you upon discharge.  Take your pain medication as prescribed, if needed.  If narcotic pain medicine is not needed, then you may take acetaminophen (Tylenol) or ibuprofen (Advil) as needed. °2. Take your usually prescribed medications unless otherwise directed. °3. If you need a refill on your pain medication, please contact your pharmacy.  They will contact our office to request authorization. Prescriptions will not be filled after 5pm or on week-ends. °4. You should follow a light diet the first few days after arrival home, such as soup and crackers, etc.  Be sure to include lots of fluids daily. °5. Most patients will experience some swelling and bruising in the area of the incisions.  Ice packs will help.  Swelling and bruising can take several days to resolve.  °6. It is common to experience some constipation if taking pain medication after surgery.  Increasing fluid intake and taking a stool softener (such as Colace) will usually help or prevent this problem from occurring.  A mild laxative (Milk of Magnesia or Miralax) should be taken according to package instructions if there are no bowel movements after 48 hours. °7. Unless discharge instructions indicate otherwise, you may remove your bandages 24-48  hours after surgery, and you may shower at that time.  You may have steri-strips (small skin tapes) in place directly over the incision.  These strips should be left on the skin for 7-10 days.  If your surgeon used skin glue on the incision, you may shower in 24 hours.  The glue will flake off over the next 2-3 weeks.  Any sutures or staples will be removed at the office during your follow-up visit. °8. ACTIVITIES:  You may resume regular (light) daily activities beginning the next day--such as daily self-care, walking, climbing stairs--gradually increasing activities as tolerated.  You may have sexual intercourse when it is comfortable.  Refrain from any heavy lifting or straining until approved by your doctor. °a. You may drive when you are no longer taking prescription pain medication, you can comfortably wear a seatbelt, and you can safely maneuver your car and apply brakes. °b. RETURN TO WORK:  __________________________________________________________ °9. You should see your doctor in the office for a follow-up appointment approximately 2-3 weeks after your surgery.  Make sure that you call for this appointment within a day or   two after you arrive home to insure a convenient appointment time. °10. OTHER INSTRUCTIONS: __________________________________________________________________________________________________________________________ __________________________________________________________________________________________________________________________ °WHEN TO CALL YOUR DOCTOR: °1. Fever over 101.0 °2. Inability to urinate °3. Continued bleeding from incision. °4. Increased pain, redness, or drainage from the incision. °5. Increasing abdominal pain ° °The clinic staff is available to answer your questions during regular business hours.  Please don’t hesitate to call and ask to speak to one of the nurses for clinical concerns.  If you have a medical emergency, go to the nearest emergency room or call  911.  A surgeon from [facility] is always on call at the hospital. °[address], [address], [place], [place]  [address] ? [address] [address], [place], [place]   [address] °[phone] ? [phone] ? FAX [phone] °Web site: [email or web address] ° °

## 2016-08-31 NOTE — Transfer of Care (Signed)
Immediate Anesthesia Transfer of Care Note  Patient: Kenneth Meza  Procedure(s) Performed: Procedure(s): APPENDECTOMY LAPAROSCOPIC (N/A)  Patient Location: PACU  Anesthesia Type:General  Level of Consciousness: sedated  Airway & Oxygen Therapy: Patient Spontanous Breathing and Patient connected to face mask oxygen  Post-op Assessment: Report given to RN and Post -op Vital signs reviewed and stable  Post vital signs: Reviewed and stable  Last Vitals:  Vitals:   08/31/16 0319 08/31/16 0451  BP: 145/84 (!) 159/78  Pulse: 60   Resp: 20 20  Temp:  37.2 C    Last Pain:  Vitals:   08/31/16 0621  TempSrc:   PainSc: 0-No pain      Patients Stated Pain Goal: 2 (08/31/16 0545)  Complications: No apparent anesthesia complications

## 2016-08-31 NOTE — Anesthesia Postprocedure Evaluation (Signed)
Anesthesia Post Note  Patient: Pesach Albertine PatriciaJ Hallmon  Procedure(s) Performed: Procedure(s) (LRB): APPENDECTOMY LAPAROSCOPIC (N/A)  Patient location during evaluation: PACU Anesthesia Type: General Level of consciousness: awake and alert Pain management: pain level controlled Vital Signs Assessment: post-procedure vital signs reviewed and stable Respiratory status: spontaneous breathing, nonlabored ventilation, respiratory function stable and patient connected to nasal cannula oxygen Cardiovascular status: blood pressure returned to baseline and stable Postop Assessment: no signs of nausea or vomiting Anesthetic complications: no       Last Vitals:  Vitals:   08/31/16 1115 08/31/16 1130  BP: (!) 155/98 (!) 168/74  Pulse: 91 96  Resp: 15 (!) 26  Temp: 36.3 C 36.8 C    Last Pain:  Vitals:   08/31/16 1125  TempSrc:   PainSc: 5                  Donetta Isaza S

## 2016-08-31 NOTE — ED Notes (Signed)
ED Provider at bedside. 

## 2016-08-31 NOTE — Op Note (Signed)
Re:   Kenneth Meza DOB:   05/30/1996 MRN:   [id number]                   [facility]  DATE OF PROCEDURE: 08/31/2016                              OPERATIVE REPORT  PREOPERATIVE DIAGNOSIS:  Appendicitis  POSTOPERATIVE DIAGNOSIS:  Acute purulent appendicitis.  PROCEDURE:  Laparoscopic appendectomy.  SURGEON:  Sandria Balesavid H. Kenneth StandingNewman, MD  ASSISTANT:  Shelbie ProctorMatt Wallman, PA student  ANESTHESIA:  General endotracheal.  Anesthesiologist: Kenneth GhaziGeorge Rose, MD CRNA: Kenneth PaceJoanne E Gray, CRNA; Doran ClayStephen R Alday, CRNA  ASA:  1E  ESTIMATED BLOOD LOSS:  Minimal.  DRAINS: none   SPECIMEN:   Appendix  COUNTS CORRECT:  YES  INDICATIONS FOR PROCEDURE: Kenneth Meza is a 21 y.o. (DOB: 10/09/1995) AA male whose primary care doctor is No PCP Per Patient and comes to the OR for an appendectomy.   I discussed with the patient, the indications and potential complications of appendiceal surgery.  The potential complications include, but are not limited to, bleeding, open surgery, bowel resection, and the possibility of another diagnosis.  OPERATIVE NOTE:  The patient underwent a general endotracheal anesthetic as supervised by Anesthesiologist: Kenneth GhaziGeorge Rose, MD CRNA: Kenneth PaceJoanne E Gray, CRNA; Doran ClayStephen R Alday, CRNA, General, in room #4.  The patient was on Zosyn prio the beginning of the procedure and the abdomen was prepped with ChloraPrep.   A time-out was held and surgical checklist run.  An infraumbilical incision was made with sharp dissection carried down to the abdominal cavity.  An 12 mm Hasson trocar was inserted through the infraumbilical incision and into the peritoneal cavity.  A 0 degree 10 mm laparoscope was inserted through a 12 mm Hasson trocar and the Hasson trocar secured with a 0 Vicryl suture.  I placed a 5 mm trocar in the right upper quadrant and 5 mm torcar in left lower quadrant and did abdominal exploration.    The right and left lobes of liver unremarkable.  Stomach was unremarkable.  The pelvic  organs were unremarkable.  I saw no other intra-abdominal abnormality.  The patient had purulent appendicitis with the appendix curled in the retrocecum.  The appendix had an appearance of being more chronic, in that it had a dense scar that stuck it along the posterior cecum.  The mesentery of the appendix was divided with a Harmonic scalpel.  I got to the base of the appendix.  I then used a blue load 45 mm Ethicon Endo-GIA stapler and fired this across the base of the appendix.  I placed the appendix in EndoCatch bag and delivered the bag through the umbilical incision.  I irrigated the abdomen with 600 cc of saline.  After irrigating the abdomen, I then removed the trocars, in turn.  The umbilical port fascia was closed with 0 Vicryl suture.   I closed the skin each site with a 4-0 Monocryl suture and painted the wounds with DermaBond.  I then injected a total of 30 mL of 0.25% Marcaine at the incisions.  Sponge and needle count were correct at the end of the case.  The patient was transferred to the recovery room in good condition.  The patient tolerated the procedure well and it depends on the patient's post op clinical course as to when the patient could be discharged.   Kenneth Kinavid Shameka Aggarwal, MD,  [facility] Pager: [phone] Office phone:  [phone]

## 2016-08-31 NOTE — ED Notes (Signed)
Pt ambulatory to bathroom

## 2016-09-01 LAB — HIV ANTIBODY (ROUTINE TESTING W REFLEX): HIV SCREEN 4TH GENERATION: NONREACTIVE

## 2016-09-01 MED ORDER — HYDROCODONE-ACETAMINOPHEN 5-325 MG PO TABS: 1.0000 | ORAL_TABLET | Freq: Four times a day (QID) | ORAL | 0 refills | Status: DC | PRN

## 2016-09-01 NOTE — Discharge Summary (Addendum)
Patient ID: Kenneth Meza [id number] 21 y.o. 08/04/1995  08/30/2016  Discharge date and time: No discharge date for patient encounter.  Admitting Physician: Dr [name]  Discharge Physician: [name], [name]  Admission Diagnoses: Acute appendicitis, unspecified acute appendicitis type [K35.80]  Discharge Diagnoses: acute appendicitis  Operations: Procedure(s): APPENDECTOMY LAPAROSCOPIC    Discharged Condition: good    Hospital Course: Pt admitted for overnight observation.  Did well.  Consults: None  Significant Diagnostic Studies: labs: cbc, chemistry  Treatments: IV hydration, antibiotics: Zosyn, analgesia: Vicodin and surgery: lap appy  Disposition: Home    I have searched the NCCSRS database and see no other narcotics prescriptions for this patient.

## 2016-09-01 NOTE — Progress Notes (Signed)
Mr. Kenneth Meza has been an inpatient from 2/22 - 09/01/16 at [facility]. He may return to school on Mon., 2/26. Rondall AllegraFred Tamyra Fojtik RN [phone]

## 2016-09-03 ENCOUNTER — Encounter (HOSPITAL_COMMUNITY): Payer: Self-pay | Admitting: Surgery

## 2016-12-10 NOTE — Addendum Note (Signed)
Addendum  created 12/10/16 1131 by Natarsha Hurwitz, MD   Sign clinical note    

## 2016-12-10 NOTE — Anesthesia Postprocedure Evaluation (Signed)
Anesthesia Post Note  Patient: Kenneth Meza  Procedure(s) Performed: Procedure(s) (LRB): APPENDECTOMY LAPAROSCOPIC (N/A)     Anesthesia Post Evaluation  Last Vitals:  Vitals:   09/01/16 0157 09/01/16 0525  BP: 115/67 125/71  Pulse: 62 89  Resp: 18 16  Temp: 36.8 C 36.8 C    Last Pain:  Vitals:   09/01/16 0525  TempSrc: Oral  PainSc: Asleep                 Manreet Kiernan S

## 2019-02-25 ENCOUNTER — Emergency Department: Payer: BC Managed Care – PPO

## 2019-02-25 ENCOUNTER — Emergency Department
Admission: EM | Admit: 2019-02-25 | Discharge: 2019-02-25 | Disposition: A | Discharge status: Home / Self Care | Payer: BC Managed Care – PPO | Attending: Emergency Medicine | Admitting: Emergency Medicine

## 2019-02-25 ENCOUNTER — Other Ambulatory Visit: Payer: Self-pay

## 2019-02-25 ENCOUNTER — Encounter: Payer: Self-pay | Admitting: Emergency Medicine

## 2019-02-25 DIAGNOSIS — Y999 Unspecified external cause status: Secondary | ICD-10-CM | POA: Diagnosis not present

## 2019-02-25 DIAGNOSIS — S39012A Strain of muscle, fascia and tendon of lower back, initial encounter: Secondary | ICD-10-CM | POA: Insufficient documentation

## 2019-02-25 DIAGNOSIS — Y92410 Unspecified street and highway as the place of occurrence of the external cause: Secondary | ICD-10-CM | POA: Diagnosis not present

## 2019-02-25 DIAGNOSIS — Y939 Activity, unspecified: Secondary | ICD-10-CM | POA: Diagnosis not present

## 2019-02-25 DIAGNOSIS — M7918 Myalgia, other site: Secondary | ICD-10-CM

## 2019-02-25 DIAGNOSIS — S3992XA Unspecified injury of lower back, initial encounter: Secondary | ICD-10-CM | POA: Diagnosis present

## 2019-02-25 MED ORDER — TRAMADOL HCL 50 MG PO TABS: 50.0000 mg | ORAL_TABLET | Freq: Four times a day (QID) | ORAL | 0 refills | Status: AC | PRN

## 2019-02-25 MED ORDER — IBUPROFEN 600 MG PO TABS: 600.0000 mg | ORAL_TABLET | Freq: Three times a day (TID) | ORAL | 0 refills | Status: AC | PRN

## 2019-02-25 MED ORDER — CYCLOBENZAPRINE HCL 10 MG PO TABS: 10.0000 mg | ORAL_TABLET | Freq: Three times a day (TID) | ORAL | 0 refills | Status: AC | PRN
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
## 2023-10-03 ENCOUNTER — Ambulatory Visit
Admission: EM | Admit: 2023-10-03 | Discharge: 2023-10-03 | Disposition: A | Discharge status: Home / Self Care | Attending: Emergency Medicine | Admitting: Emergency Medicine

## 2023-10-03 DIAGNOSIS — J069 Acute upper respiratory infection, unspecified: Secondary | ICD-10-CM

## 2023-10-03 LAB — POC COVID19/FLU A&B COMBO
Covid Antigen, POC: NEGATIVE
Influenza A Antigen, POC: NEGATIVE
Influenza B Antigen, POC: NEGATIVE

## 2023-10-03 NOTE — Discharge Instructions (Addendum)
 The COVID and flu tests are negative.   Take Tylenol or ibuprofen as needed for fever or discomfort.  Take plain Mucinex as needed for congestion.  Rest and keep yourself hydrated.    Follow-up with your primary care provider if your symptoms are not improving.  Urgency department if you have worsening symptoms.

## 2023-10-03 NOTE — ED Triage Notes (Signed)
 Patient to Urgent Care with complaints of cough/ headaches/ fatigue/ neck pain. Possible fever yesterday (unable to check temp). Body aches.   Symptoms started two days ago.  Taking tylenol.

## 2023-10-03 NOTE — ED Provider Notes (Signed)
 Kenneth Meza    CSN: [id number] Arrival date & time: 10/03/23  1009      History   Chief Complaint Chief Complaint  Patient presents with   Cough   Headache    HPI Kenneth Meza is a 28 y.o. male.  Patient presents with 2-day history of cough.  He reports subjective fever, neck pain, headache, fatigue, body aches yesterday but the symptoms have improved.  No OTC medications taken today; took Tylenol yesterday.  No shortness of breath, vomiting, diarrhea.  The history is provided by the patient and medical records.    History reviewed. No pertinent past medical history.  Patient Active Problem List   Diagnosis Date Noted   Appendicitis 08/31/2016    Past Surgical History:  Procedure Laterality Date   LAPAROSCOPIC APPENDECTOMY N/A 08/31/2016   Procedure: APPENDECTOMY LAPAROSCOPIC;  Surgeon: Ovidio Kin, MD;  Location: [facility];  Service: General;  Laterality: N/A;       Home Medications    Prior to Admission medications   Medication Sig Start Date End Date Taking? Authorizing Provider  cyclobenzaprine (FLEXERIL) 10 MG tablet Take 1 tablet (10 mg total) by mouth 3 (three) times daily as needed. Patient not taking: Reported on 10/03/2023 02/25/19   Joni Reining, PA-C  ibuprofen (ADVIL) 600 MG tablet Take 1 tablet (600 mg total) by mouth every 8 (eight) hours as needed. Patient not taking: Reported on 10/03/2023 02/25/19   Joni Reining, PA-C    Family History History reviewed. No pertinent family history.  Social History Social History   Tobacco Use   Smoking status: Never   Smokeless tobacco: Never  Substance Use Topics   Alcohol use: Yes    Comment: occasional   Drug use: No     Allergies   Patient has no known allergies.   Review of Systems Review of Systems  Constitutional:  Negative for chills and fever.  HENT:  Negative for ear pain and sore throat.   Respiratory:  Positive for cough. Negative for shortness of breath.    Gastrointestinal:  Negative for diarrhea and vomiting.  Skin:  Negative for color change and rash.     Physical Exam Triage Vital Signs ED Triage Vitals  Encounter Vitals Group     BP 10/03/23 1107 132/84     Systolic BP Percentile --      Diastolic BP Percentile --      Pulse Rate 10/03/23 1107 81     Resp 10/03/23 1107 18     Temp 10/03/23 1107 98.2 F (36.8 C)     Temp src --      SpO2 10/03/23 1107 96 %     Weight --      Height --      Head Circumference --      Peak Flow --      Pain Score 10/03/23 1105 6     Pain Loc --      Pain Education --      Exclude from Growth Chart --    No data found.  Updated Vital Signs BP 132/84   Pulse 81   Temp 98.2 F (36.8 C)   Resp 18   SpO2 96%   Visual Acuity Right Eye Distance:   Left Eye Distance:   Bilateral Distance:    Right Eye Near:   Left Eye Near:    Bilateral Near:     Physical Exam Constitutional:      General: He is  not in acute distress. HENT:     Right Ear: Tympanic membrane normal.     Left Ear: Tympanic membrane normal.     Nose: Nose normal.     Mouth/Throat:     Mouth: Mucous membranes are moist.     Pharynx: Oropharynx is clear.  Cardiovascular:     Rate and Rhythm: Normal rate and regular rhythm.     Heart sounds: Normal heart sounds.  Pulmonary:     Effort: Pulmonary effort is normal. No respiratory distress.     Breath sounds: Normal breath sounds.  Musculoskeletal:     Cervical back: Neck supple. No rigidity.  Neurological:     Mental Status: He is alert.      UC Treatments / Results  Labs (all labs ordered are listed, but only abnormal results are displayed) Labs Reviewed  POC COVID19/FLU A&B COMBO    EKG   Radiology No results found.  Procedures Procedures (including critical care time)  Medications Ordered in UC Medications - No data to display  Initial Impression / Assessment and Plan / UC Course  I have reviewed the triage vital signs and the nursing  notes.  Pertinent labs & imaging results that were available during my care of the patient were reviewed by me and considered in my medical decision making (see chart for details).    Viral URI with cough.  Afebrile and vital signs are stable.  Lungs are clear and O2 sat is 96% on room air.  Rapid COVID and flu negative.  Discussed symptomatic treatment including Tylenol or ibuprofen as needed for fever or discomfort, plain Mucinex as needed for congestion, rest, hydration.  Instructed patient to follow-up with his PCP if not improving.  ED precautions given.  Patient agrees to plan of care.    Final Clinical Impressions(s) / UC Diagnoses   Final diagnoses:  Viral URI with cough     Discharge Instructions      The COVID and flu tests are negative.   Take Tylenol or ibuprofen as needed for fever or discomfort.  Take plain Mucinex as needed for congestion.  Rest and keep yourself hydrated.    Follow-up with your primary care provider if your symptoms are not improving.  Urgency department if you have worsening symptoms.       ED Prescriptions   None    PDMP not reviewed this encounter.   Mickie Bail, NP 10/03/23 [phone]
